# Patient Record
Sex: Male | Born: 1965 | Race: White | Hispanic: No | Marital: Married | Smoking: Current every day smoker
Health system: Southern US, Community
[De-identification: ages and names within clinical notes are randomized; demographics above are authoritative.]

## PROBLEM LIST (undated history)

## (undated) DIAGNOSIS — E079 Disorder of thyroid, unspecified: Secondary | ICD-10-CM

## (undated) DIAGNOSIS — E119 Type 2 diabetes mellitus without complications: Secondary | ICD-10-CM

## (undated) HISTORY — PX: FACIAL FRACTURE SURGERY: SHX1570

## (undated) HISTORY — PX: KNEE SURGERY: SHX244

---

## 2014-11-24 ENCOUNTER — Encounter (HOSPITAL_COMMUNITY): Payer: Self-pay | Admitting: Emergency Medicine

## 2014-11-24 ENCOUNTER — Emergency Department (HOSPITAL_COMMUNITY)
Admission: EM | Admit: 2014-11-24 | Discharge: 2014-11-24 | Disposition: A | Discharge status: Home / Self Care | Payer: Medicare Other | Attending: Emergency Medicine | Admitting: Emergency Medicine

## 2014-11-24 DIAGNOSIS — E119 Type 2 diabetes mellitus without complications: Secondary | ICD-10-CM | POA: Diagnosis not present

## 2014-11-24 DIAGNOSIS — G8929 Other chronic pain: Secondary | ICD-10-CM

## 2014-11-24 DIAGNOSIS — Z720 Tobacco use: Secondary | ICD-10-CM | POA: Insufficient documentation

## 2014-11-24 DIAGNOSIS — M25512 Pain in left shoulder: Secondary | ICD-10-CM | POA: Insufficient documentation

## 2014-11-24 HISTORY — DX: Type 2 diabetes mellitus without complications: E11.9

## 2014-11-24 HISTORY — DX: Disorder of thyroid, unspecified: E07.9

## 2014-11-24 MED ORDER — ACETAMINOPHEN-CODEINE #3 300-30 MG PO TABS: 1.0000 | ORAL_TABLET | Freq: Four times a day (QID) | ORAL | Status: DC | PRN

## 2014-11-24 NOTE — ED Notes (Signed)
Pt reports increased pain to left shoulder. Pt reports supposed to follow-up with ortho surgeon this week but reports intense pain. Pt denies any new or recent injury. nad noted.
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
## 2014-11-24 NOTE — ED Notes (Signed)
Pt verbalized understanding of no driving and to use caution within 4 hours of taking pain meds due to meds cause drowsiness 

## 2014-11-24 NOTE — Discharge Instructions (Signed)
Please review the resource guides that you were given to establish a primary physician. Please discuss your pain with your orthopedic specialist. Chronic Pain Chronic pain can be defined as pain that is off and on and lasts for 3-6 months or longer. Many things cause chronic pain, which can make it difficult to make a diagnosis. There are many treatment options available for chronic pain. However, finding a treatment that works well for you may require trying various approaches until the right one is found. Many people benefit from a combination of two or more types of treatment to control their pain. SYMPTOMS  Chronic pain can occur anywhere in the body and can range from mild to very severe. Some types of chronic pain include:  Headache.  Low back pain.  Cancer pain.  Arthritis pain.  Neurogenic pain. This is pain resulting from damage to nerves. People with chronic pain may also have other symptoms such as:  Depression.  Anger.  Insomnia.  Anxiety. DIAGNOSIS  Your health care provider will help diagnose your condition over time. In many cases, the initial focus will be on excluding possible conditions that could be causing the pain. Depending on your symptoms, your health care provider may order tests to diagnose your condition. Some of these tests may include:   Blood tests.   CT scan.   MRI.   X-rays.   Ultrasounds.   Nerve conduction studies.  You may need to see a specialist.  TREATMENT  Finding treatment that works well may take time. You may be referred to a pain specialist. He or she may prescribe medicine or therapies, such as:   Mindful meditation or yoga.  Shots (injections) of numbing or pain-relieving medicines into the spine or area of pain.  Local electrical stimulation.  Acupuncture.   Massage therapy.   Aroma, color, light, or sound therapy.   Biofeedback.   Working with a physical therapist to keep from getting stiff.    Regular, gentle exercise.   Cognitive or behavioral therapy.   Group support.  Sometimes, surgery may be recommended.  HOME CARE INSTRUCTIONS   Take all medicines as directed by your health care provider.   Lessen stress in your life by relaxing and doing things such as listening to calming music.   Exercise or be active as directed by your health care provider.   Eat a healthy diet and include things such as vegetables, fruits, fish, and lean meats in your diet.   Keep all follow-up appointments with your health care provider.   Attend a support group with others suffering from chronic pain. SEEK MEDICAL CARE IF:   Your pain gets worse.   You develop a new pain that was not there before.   You cannot tolerate medicines given to you by your health care provider.   You have new symptoms since your last visit with your health care provider.  SEEK IMMEDIATE MEDICAL CARE IF:   You feel weak.   You have decreased sensation or numbness.   You lose control of bowel or bladder function.   Your pain suddenly gets much worse.   You develop shaking.  You develop chills.  You develop confusion.  You develop chest pain.  You develop shortness of breath.  MAKE SURE YOU:  Understand these instructions.  Will watch your condition.  Will get help right away if you are not doing well or get worse. Document Released: 05/14/2002 Document Revised: 04/24/2013 Document Reviewed: 02/15/2013 [facility] Patient Information 2015 [employer]. This information is not intended to replace advice given to you by your health care provider. Make sure you discuss any questions you have with your health care provider.

## 2014-12-16 ENCOUNTER — Ambulatory Visit (INDEPENDENT_AMBULATORY_CARE_PROVIDER_SITE_OTHER): Payer: Medicare Other | Admitting: Orthopedic Surgery

## 2014-12-16 ENCOUNTER — Encounter: Payer: Self-pay | Admitting: Orthopedic Surgery

## 2014-12-16 VITALS — BP 140/92 | Ht 68.0 in | Wt 155.0 lb

## 2014-12-16 DIAGNOSIS — M75102 Unspecified rotator cuff tear or rupture of left shoulder, not specified as traumatic: Secondary | ICD-10-CM

## 2014-12-16 MED ORDER — HYDROCODONE-ACETAMINOPHEN 5-325 MG PO TABS: 1.0000 | ORAL_TABLET | Freq: Four times a day (QID) | ORAL | Status: DC | PRN

## 2014-12-16 NOTE — Patient Instructions (Signed)
We will schedule MRI for you and call you with results 
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
## 2014-12-25 ENCOUNTER — Encounter (HOSPITAL_COMMUNITY): Payer: Self-pay | Admitting: Cardiology

## 2014-12-25 ENCOUNTER — Emergency Department (HOSPITAL_COMMUNITY)
Admission: EM | Admit: 2014-12-25 | Discharge: 2014-12-25 | Disposition: A | Discharge status: Home / Self Care | Payer: Medicare Other | Attending: Emergency Medicine | Admitting: Emergency Medicine

## 2014-12-25 ENCOUNTER — Ambulatory Visit (HOSPITAL_COMMUNITY)
Admission: RE | Admit: 2014-12-25 | Discharge: 2014-12-25 | Disposition: A | Discharge status: Home / Self Care | Payer: Medicare Other | Source: Ambulatory Visit | Attending: Orthopedic Surgery | Admitting: Orthopedic Surgery

## 2014-12-25 DIAGNOSIS — X58XXXD Exposure to other specified factors, subsequent encounter: Secondary | ICD-10-CM | POA: Diagnosis not present

## 2014-12-25 DIAGNOSIS — M25512 Pain in left shoulder: Secondary | ICD-10-CM | POA: Insufficient documentation

## 2014-12-25 DIAGNOSIS — Z720 Tobacco use: Secondary | ICD-10-CM | POA: Insufficient documentation

## 2014-12-25 DIAGNOSIS — Z794 Long term (current) use of insulin: Secondary | ICD-10-CM | POA: Diagnosis not present

## 2014-12-25 DIAGNOSIS — S4992XD Unspecified injury of left shoulder and upper arm, subsequent encounter: Secondary | ICD-10-CM | POA: Diagnosis not present

## 2014-12-25 DIAGNOSIS — Z79899 Other long term (current) drug therapy: Secondary | ICD-10-CM | POA: Diagnosis not present

## 2014-12-25 DIAGNOSIS — E079 Disorder of thyroid, unspecified: Secondary | ICD-10-CM | POA: Diagnosis not present

## 2014-12-25 DIAGNOSIS — M75102 Unspecified rotator cuff tear or rupture of left shoulder, not specified as traumatic: Secondary | ICD-10-CM

## 2014-12-25 DIAGNOSIS — M25612 Stiffness of left shoulder, not elsewhere classified: Secondary | ICD-10-CM | POA: Insufficient documentation

## 2014-12-25 DIAGNOSIS — E119 Type 2 diabetes mellitus without complications: Secondary | ICD-10-CM | POA: Insufficient documentation

## 2014-12-25 MED ORDER — HYDROCODONE-ACETAMINOPHEN 7.5-325 MG PO TABS: 1.0000 | ORAL_TABLET | Freq: Four times a day (QID) | ORAL | Status: DC | PRN

## 2014-12-25 NOTE — Discharge Instructions (Signed)
Shoulder Pain  The shoulder is the joint that connects your arm to your body. Muscles and band-like tissues that connect bones to muscles (tendons) hold the joint together. Shoulder pain is felt if an injury or medical problem affects one or more parts of the shoulder.  HOME CARE   · Put ice on the sore area.  ¨ Put ice in a plastic bag.  ¨ Place a towel between your skin and the bag.  ¨ Leave the ice on for 15-20 minutes, 03-04 times a day for the first 2 days.  · Stop using cold packs if they do not help with the pain.  · If you were given something to keep your shoulder from moving (sling; shoulder immobilizer), wear it as told. Only take it off to shower or bathe.  · Move your arm as little as possible, but keep your hand moving to prevent puffiness (swelling).  · Squeeze a soft ball or foam pad as much as possible to help prevent swelling.  · Take medicine as told by your doctor.  GET HELP IF:  · You have progressing new pain in your arm, hand, or fingers.  · Your hand or fingers get cold.  · Your medicine does not help lessen your pain.  GET HELP RIGHT AWAY IF:   · Your arm, hand, or fingers are numb or tingling.  · Your arm, hand, or fingers are puffy (swollen), painful, or turn white or blue.  MAKE SURE YOU:   · Understand these instructions.  · Will watch your condition.  · Will get help right away if you are not doing well or get worse.  Document Released: 02/08/2008 Document Revised: 01/06/2014 Document Reviewed: 03/05/2012  ExitCare® Patient Information ©2015 [employer]. This information is not intended to replace advice given to you by your health care provider. Make sure you discuss any questions you have with your health care provider.

## 2014-12-25 NOTE — ED Notes (Signed)
Left shoulder pain for a while.  Had an MRI today.  To call Monday with follow up.  Out of pain medication.

## 2014-12-28 NOTE — ED Provider Notes (Signed)
CSN: [id number]     Arrival date & time 12/25/14  1738 History   First MD Initiated Contact with Patient 12/25/14 1814     Chief Complaint  Patient presents with  . Shoulder Pain     (Consider location/radiation/quality/duration/timing/severity/associated sxs/prior Treatment) HPI  Marcus Wallace is a 49 y.o. male who presents to the Emergency Department complaining of left shoulder pain for several months.  He states that he recently had an MRI of his shoulder and is followed by Dr. Romeo AppleHarrison.  He states that he came to the ED because he tried to contact Dr. Mort SawyersHarrison's office and was told to come to the ED because his pain is worsening.  Pain is aggrevated by movement of the arm and improves with rest.  He denies swelling, recent injury, numbness or weakness of the extremity, and neck pain.     Past Medical History  Diagnosis Date  . Diabetes mellitus without complication   . Thyroid disease    Past Surgical History  Procedure Laterality Date  . Knee surgery     History reviewed. No pertinent family history. History  Substance Use Topics  . Smoking status: Current Every Day Smoker -- 0.50 packs/day  . Smokeless tobacco: Not on file  . Alcohol Use: No    Review of Systems  Constitutional: Negative for fever and chills.  Respiratory: Negative for shortness of breath.   Cardiovascular: Negative for chest pain.  Musculoskeletal: Positive for arthralgias (left shoulder pain). Negative for joint swelling, neck pain and neck stiffness.  Skin: Negative for color change and wound.  Neurological: Negative for dizziness, weakness, numbness and headaches.  All other systems reviewed and are negative.     Allergies  Naproxen and Ultram  Home Medications   Prior to Admission medications   Medication Sig Start Date End Date Taking? Authorizing Provider  insulin glargine (LANTUS) 100 UNIT/ML injection Inject 35 Units into the skin every morning.   Yes Historical Provider, MD   insulin lispro (HUMALOG) 100 UNIT/ML injection Inject 3-7 Units into the skin 3 (three) times daily before meals. Sliding scale   Yes Historical Provider, MD  levothyroxine (SYNTHROID, LEVOTHROID) 125 MCG tablet Take 125 mcg by mouth daily before breakfast.   Yes Historical Provider, MD  UNKNOWN TO PATIENT Take 1 tablet by mouth daily. BP MEDICATION-NAME IS UNKNOWN   Yes Historical Provider, MD  Vitamin D, Ergocalciferol, (DRISDOL) 50000 UNITS CAPS capsule Take 50,000 Units by mouth every 7 (seven) days.   Yes Historical Provider, MD  HYDROcodone-acetaminophen (NORCO) 7.5-325 MG per tablet Take 1 tablet by mouth every 6 (six) hours as needed for moderate pain. 12/25/14   Sherly Brodbeck, PA-C   BP 146/91 mmHg  Pulse 88  Temp(Src) 98.8 F (37.1 C) (Oral)  Resp 20  Ht 5\' 8"  (1.727 m)  Wt 155 lb (70.308 kg)  BMI 23.57 kg/m2  SpO2 99% Physical Exam  Constitutional: He is oriented to person, place, and time. He appears well-developed and well-nourished. No distress.  HENT:  Head: Normocephalic and atraumatic.  Neck: Normal range of motion. Neck supple.  Cardiovascular: Normal rate, regular rhythm, normal heart sounds and intact distal pulses.   No murmur heard. Pulmonary/Chest: Effort normal and breath sounds normal. No respiratory distress. He exhibits no tenderness.  Musculoskeletal: He exhibits no edema.  Tenderness of the anterior left shoulder.  Pain reproduced with abduction.  Distal sensation intact.  Radial pulse brisk.  No cervical spine tenderness  Neurological: He is alert and oriented to person,  place, and time. He exhibits normal muscle tone. Coordination normal.  Skin: Skin is warm and dry.  Nursing note and vitals reviewed.   ED Course  Procedures (including critical care time) Labs Review Labs Reviewed - No data to display  Imaging Review No results found.   EKG Interpretation None      MDM   Final diagnoses:  Shoulder pain, left    Pt had MRI on 12/25/14  ordered by Dr. Romeo Apple that shows:  Mild appearing supraspinatus infraspinatus tendinopathy without tear.  Mild acromioclavicular degenerative change.   Pt is NV intact.  No concerning sx's for septic joint.  Advised pt that he will need to arrange further f/u with Dr. Mort Sawyers office.  He agrees to plans and appears stable for d/c  Pauline Aus, PA-C 12/28/14 2340  Glynn Octave, MD 12/29/14 [phone]

## 2014-12-29 ENCOUNTER — Ambulatory Visit (INDEPENDENT_AMBULATORY_CARE_PROVIDER_SITE_OTHER): Payer: Medicare Other | Admitting: Orthopedic Surgery

## 2014-12-29 ENCOUNTER — Other Ambulatory Visit: Payer: Self-pay | Admitting: *Deleted

## 2014-12-29 ENCOUNTER — Encounter: Payer: Self-pay | Admitting: Orthopedic Surgery

## 2014-12-29 ENCOUNTER — Telehealth: Payer: Self-pay | Admitting: Orthopedic Surgery

## 2014-12-29 VITALS — BP 138/90 | Ht 68.0 in | Wt 155.0 lb

## 2014-12-29 DIAGNOSIS — M75102 Unspecified rotator cuff tear or rupture of left shoulder, not specified as traumatic: Secondary | ICD-10-CM

## 2014-12-29 MED ORDER — HYDROCODONE-ACETAMINOPHEN 5-325 MG PO TABS: 1.0000 | ORAL_TABLET | Freq: Three times a day (TID) | ORAL | Status: DC | PRN

## 2014-12-29 NOTE — Telephone Encounter (Signed)
NOT SURE WHAT TO DO ABOUT THIS SINCE WE HAVE NO WAY OF CONTACTING PATIENT
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
## 2014-12-29 NOTE — Patient Instructions (Signed)
Gradual return to normal activity  

## 2014-12-29 NOTE — Telephone Encounter (Signed)
Patient called - states has NO PHONE or Alternate # at this time - asking for MRI results from 12/25/14 and for refill of pain medication:   HYDROcodone-acetaminophen (NORCO) 7.5-325 MG per tablet [[id number]][[id number]]     - Note - [facility] 12/[facility]/14.

## 2015-01-20 ENCOUNTER — Telehealth: Payer: Self-pay | Admitting: Orthopedic Surgery

## 2015-01-20 NOTE — Telephone Encounter (Signed)
Normal MRI, new appointment?

## 2015-01-20 NOTE — Telephone Encounter (Signed)
Patient called, relays that his shoulder is slightly improved, but "not a whole lot better."  He is asking for another appointment; seen last 12/29/14.  Please advise of recommendation or if he's to schedule a visit.  Ph# [phone]

## 2015-01-20 NOTE — Telephone Encounter (Signed)
i want him to go to therapy 1st   3 x a week x 4 weeks then if not better i can see  i dont have any thing else to do for him at this point

## 2015-01-21 NOTE — Telephone Encounter (Signed)
?  OK TO REFILL °

## 2015-01-21 NOTE — Telephone Encounter (Signed)
Patient called back today, 01/21/15, asking for refill on pain medication, Norco.  Please advise.

## 2015-01-21 NOTE — Telephone Encounter (Signed)
Called patient, no answer, mailbox full

## 2015-01-22 ENCOUNTER — Other Ambulatory Visit: Payer: Self-pay | Admitting: *Deleted

## 2015-01-22 ENCOUNTER — Other Ambulatory Visit: Payer: Self-pay | Admitting: Orthopedic Surgery

## 2015-01-22 DIAGNOSIS — M75102 Unspecified rotator cuff tear or rupture of left shoulder, not specified as traumatic: Secondary | ICD-10-CM

## 2015-01-22 DIAGNOSIS — M25511 Pain in right shoulder: Secondary | ICD-10-CM

## 2015-01-22 MED ORDER — HYDROCODONE-ACETAMINOPHEN 5-325 MG PO TABS: 1.0000 | ORAL_TABLET | Freq: Three times a day (TID) | ORAL | Status: AC | PRN

## 2015-01-22 MED ORDER — GABAPENTIN 100 MG PO CAPS: 100.0000 mg | ORAL_CAPSULE | Freq: Three times a day (TID) | ORAL | Status: AC

## 2015-01-22 NOTE — Telephone Encounter (Signed)
PATIENT PICKED UP RX AND HE HAS THE NUMBER TO [facility] THEY JUST NEED THE ORDER

## 2015-02-05 ENCOUNTER — Encounter (HOSPITAL_COMMUNITY): Payer: Self-pay

## 2015-02-05 ENCOUNTER — Emergency Department (HOSPITAL_COMMUNITY): Payer: Medicare Other

## 2015-02-05 ENCOUNTER — Emergency Department (HOSPITAL_COMMUNITY)
Admission: EM | Admit: 2015-02-05 | Discharge: 2015-02-05 | Disposition: A | Discharge status: Home / Self Care | Payer: Medicare Other | Attending: Emergency Medicine | Admitting: Emergency Medicine

## 2015-02-05 DIAGNOSIS — R1012 Left upper quadrant pain: Secondary | ICD-10-CM | POA: Diagnosis present

## 2015-02-05 DIAGNOSIS — Z79899 Other long term (current) drug therapy: Secondary | ICD-10-CM | POA: Diagnosis not present

## 2015-02-05 DIAGNOSIS — Z720 Tobacco use: Secondary | ICD-10-CM | POA: Insufficient documentation

## 2015-02-05 DIAGNOSIS — E119 Type 2 diabetes mellitus without complications: Secondary | ICD-10-CM | POA: Insufficient documentation

## 2015-02-05 DIAGNOSIS — R630 Anorexia: Secondary | ICD-10-CM | POA: Diagnosis not present

## 2015-02-05 DIAGNOSIS — E079 Disorder of thyroid, unspecified: Secondary | ICD-10-CM | POA: Diagnosis not present

## 2015-02-05 DIAGNOSIS — R197 Diarrhea, unspecified: Secondary | ICD-10-CM | POA: Insufficient documentation

## 2015-02-05 DIAGNOSIS — R109 Unspecified abdominal pain: Secondary | ICD-10-CM | POA: Diagnosis not present

## 2015-02-05 DIAGNOSIS — R110 Nausea: Secondary | ICD-10-CM | POA: Insufficient documentation

## 2015-02-05 DIAGNOSIS — M549 Dorsalgia, unspecified: Secondary | ICD-10-CM | POA: Insufficient documentation

## 2015-02-05 DIAGNOSIS — R0781 Pleurodynia: Secondary | ICD-10-CM | POA: Insufficient documentation

## 2015-02-05 DIAGNOSIS — Z794 Long term (current) use of insulin: Secondary | ICD-10-CM | POA: Insufficient documentation

## 2015-02-05 LAB — COMPREHENSIVE METABOLIC PANEL
ALT: 38 U/L (ref 17–63)
ANION GAP: 8 (ref 5–15)
AST: 37 U/L (ref 15–41)
Albumin: 4 g/dL (ref 3.5–5.0)
Alkaline Phosphatase: 67 U/L (ref 38–126)
BILIRUBIN TOTAL: 0.7 mg/dL (ref 0.3–1.2)
BUN: 6 mg/dL (ref 6–20)
CO2: 27 mmol/L (ref 22–32)
CREATININE: 0.8 mg/dL (ref 0.61–1.24)
Calcium: 9.3 mg/dL (ref 8.9–10.3)
Chloride: 100 mmol/L — ABNORMAL LOW (ref 101–111)
GLUCOSE: 85 mg/dL (ref 65–99)
Potassium: 4.2 mmol/L (ref 3.5–5.1)
Sodium: 135 mmol/L (ref 135–145)
Total Protein: 7.3 g/dL (ref 6.5–8.1)

## 2015-02-05 LAB — URINALYSIS W MICROSCOPIC (NOT AT ARMC)
Bilirubin Urine: NEGATIVE
Glucose, UA: NEGATIVE mg/dL
Hgb urine dipstick: NEGATIVE
KETONES UR: NEGATIVE mg/dL
NITRITE: NEGATIVE
PH: 5.5 (ref 5.0–8.0)
Protein, ur: NEGATIVE mg/dL
SPECIFIC GRAVITY, URINE: 1.02 (ref 1.005–1.030)
Urobilinogen, UA: 0.2 mg/dL (ref 0.0–1.0)

## 2015-02-05 LAB — CBC WITH DIFFERENTIAL/PLATELET
Basophils Absolute: 0.1 10*3/uL (ref 0.0–0.1)
Basophils Relative: 1 % (ref 0–1)
Eosinophils Absolute: 0.1 10*3/uL (ref 0.0–0.7)
Eosinophils Relative: 1 % (ref 0–5)
HCT: 41.3 % (ref 39.0–52.0)
Hemoglobin: 14.2 g/dL (ref 13.0–17.0)
Lymphocytes Relative: 22 % (ref 12–46)
Lymphs Abs: 2 10*3/uL (ref 0.7–4.0)
MCH: 32.6 pg (ref 26.0–34.0)
MCHC: 34.4 g/dL (ref 30.0–36.0)
MCV: 94.9 fL (ref 78.0–100.0)
Monocytes Absolute: 0.5 10*3/uL (ref 0.1–1.0)
Monocytes Relative: 6 % (ref 3–12)
Neutro Abs: 6.3 10*3/uL (ref 1.7–7.7)
Neutrophils Relative %: 70 % (ref 43–77)
Platelets: 198 10*3/uL (ref 150–400)
RBC: 4.35 MIL/uL (ref 4.22–5.81)
RDW: 12.9 % (ref 11.5–15.5)
WBC: 9.1 10*3/uL (ref 4.0–10.5)

## 2015-02-05 LAB — CK: Total CK: 51 U/L (ref 49–397)

## 2015-02-05 LAB — LIPASE, BLOOD: Lipase: 13 U/L — ABNORMAL LOW (ref 22–51)

## 2015-02-05 LAB — TROPONIN I

## 2015-02-05 LAB — CBG MONITORING, ED
GLUCOSE-CAPILLARY: 90 mg/dL (ref 65–99)
GLUCOSE-CAPILLARY: 40 mg/dL — AB (ref 65–99)
Glucose-Capillary: 106 mg/dL — ABNORMAL HIGH (ref 65–99)

## 2015-02-05 LAB — D-DIMER, QUANTITATIVE: D-Dimer, Quant: 0.69 ug/mL-FEU — ABNORMAL HIGH (ref 0.00–0.48)

## 2015-02-05 MED ORDER — MORPHINE SULFATE 4 MG/ML IJ SOLN
4.0000 mg | Freq: Once | INTRAMUSCULAR | Status: AC
  Administered 2015-02-05: 4 mg via INTRAVENOUS
  Filled 2015-02-05: qty 1

## 2015-02-05 MED ORDER — ONDANSETRON HCL 4 MG/2ML IJ SOLN
4.0000 mg | Freq: Once | INTRAMUSCULAR | Status: AC
  Administered 2015-02-05: 4 mg via INTRAVENOUS
  Filled 2015-02-05: qty 2

## 2015-02-05 MED ORDER — DEXTROSE 50 % IV SOLN
25.0000 mL | Freq: Once | INTRAVENOUS | Status: AC
  Administered 2015-02-05: 25 mL via INTRAVENOUS

## 2015-02-05 MED ORDER — DEXTROSE 50 % IV SOLN: 50.0000 mL | Freq: Once | INTRAVENOUS | Status: DC

## 2015-02-05 MED ORDER — IOHEXOL 350 MG/ML SOLN
100.0000 mL | Freq: Once | INTRAVENOUS | Status: AC | PRN
  Administered 2015-02-05: 100 mL via INTRAVENOUS

## 2015-02-05 MED ORDER — DEXTROSE 50 % IV SOLN
INTRAVENOUS | Status: AC
  Administered 2015-02-05: 25 mL via INTRAVENOUS
  Filled 2015-02-05: qty 50

## 2015-02-05 NOTE — ED Notes (Signed)
Glucose 106,

## 2015-02-05 NOTE — ED Notes (Signed)
Notified R.N. - patient given orange juice.

## 2015-02-05 NOTE — Discharge Instructions (Signed)
Abdominal Pain °Your testing is negative for a serious cause of your abdominal pain.  Follow up with your doctor. Return to the ED if you develop new or worsening symptoms. °Many things can cause abdominal pain. Usually, abdominal pain is not caused by a disease and will improve without treatment. It can often be observed and treated at home. Your health care provider will do a physical exam and possibly order blood tests and X-rays to help determine the seriousness of your pain. However, in many cases, more time must pass before a clear cause of the pain can be found. Before that point, your health care provider may not know if you need more testing or further treatment. °HOME CARE INSTRUCTIONS  °Monitor your abdominal pain for any changes. The following actions may help to alleviate any discomfort you are experiencing: °· Only take over-the-counter or prescription medicines as directed by your health care provider. °· Do not take laxatives unless directed to do so by your health care provider. °· Try a clear liquid diet (broth, tea, or water) as directed by your health care provider. Slowly move to a bland diet as tolerated. °SEEK MEDICAL CARE IF: °· You have unexplained abdominal pain. °· You have abdominal pain associated with nausea or diarrhea. °· You have pain when you urinate or have a bowel movement. °· You experience abdominal pain that wakes you in the night. °· You have abdominal pain that is worsened or improved by eating food. °· You have abdominal pain that is worsened with eating fatty foods. °· You have a fever. °SEEK IMMEDIATE MEDICAL CARE IF:  °· Your pain does not go away within 2 hours. °· You keep throwing up (vomiting). °· Your pain is felt only in portions of the abdomen, such as the right side or the left lower portion of the abdomen. °· You pass bloody or black tarry stools. °MAKE SURE YOU: °· Understand these instructions.   °· Will watch your condition.   °· Will get help right away if  you are not doing well or get worse.   °Document Released: 06/01/2005 Document Revised: 08/27/2013 Document Reviewed: 05/01/2013 °ExitCare® Patient Information ©2015 [employer], [employer]. This information is not intended to replace advice given to you by your health care provider. Make sure you discuss any questions you have with your health care provider. ° °

## 2015-02-05 NOTE — ED Notes (Signed)
Patient c/o of feelings like "his sugars low". CBG 40. 25ml of D50 and orange juice given

## 2015-02-05 NOTE — ED Notes (Signed)
Pt c/o luq pain and diarrhea x 1 week.  Denies fever.  Denies recent antibiotic use.

## 2015-02-05 NOTE — ED Provider Notes (Signed)
CSN: [id number]     Arrival date & time 02/05/15  1150 History   First MD Initiated Contact with Patient 02/05/15 1525     Chief Complaint  Patient presents with  . Abdominal Pain     (Consider location/radiation/quality/duration/timing/severity/associated sxs/prior Treatment) HPI Comments: Diabetic patient with one week history of left flank and upper abdominal pain. This pain is fairly constant. It is worse with movement and palpation. Associated with one episode of loose stools daily. No fever. No vomiting. Denies any shortness of breath or chest pain. The pain is not pleuritic. Denies any urinary symptoms. No testicular pain. States his sugars have been well-controlled. Denies any falls or trauma. Denies any heart history or lung history. Reports his kidney stone in the remote past. Has not been on antibiotics recently or recent travel.  The history is provided by the patient.    Past Medical History  Diagnosis Date  . Diabetes mellitus without complication   . Thyroid disease    Past Surgical History  Procedure Laterality Date  . Knee surgery    . Facial fracture surgery     No family history on file. History  Substance Use Topics  . Smoking status: Current Every Day Smoker -- 0.50 packs/day  . Smokeless tobacco: Not on file  . Alcohol Use: No    Review of Systems  Constitutional: Positive for activity change and appetite change. Negative for fever and fatigue.  HENT: Negative for congestion.   Respiratory: Negative for cough, chest tightness and shortness of breath.   Cardiovascular: Negative for chest pain.  Gastrointestinal: Positive for nausea, abdominal pain and diarrhea. Negative for vomiting.  Genitourinary: Negative for hematuria and testicular pain.  Musculoskeletal: Positive for back pain. Negative for myalgias and arthralgias.  Skin: Negative for wound.  Neurological: Negative for dizziness, weakness and headaches.  A complete 10 system review of systems was  obtained and all systems are negative except as noted in the HPI and PMH.      Allergies  Naproxen and Ultram  Home Medications   Prior to Admission medications   Medication Sig Start Date End Date Taking? Authorizing Provider  hydrochlorothiazide (MICROZIDE) 12.5 MG capsule Take 12.5 mg by mouth daily.   Yes Historical Provider, MD  insulin glargine (LANTUS) 100 UNIT/ML injection Inject 40 Units into the skin every morning.    Yes Historical Provider, MD  insulin lispro (HUMALOG) 100 UNIT/ML injection Inject 3-7 Units into the skin 3 (three) times daily before meals. Sliding scale   Yes Historical Provider, MD  levothyroxine (SYNTHROID, LEVOTHROID) 125 MCG tablet Take 125 mcg by mouth daily before breakfast.   Yes Historical Provider, MD  Vitamin D, Ergocalciferol, (DRISDOL) 50000 UNITS CAPS capsule Take 50,000 Units by mouth every Monday.    Yes Historical Provider, MD  gabapentin (NEURONTIN) 100 MG capsule Take 1 capsule (100 mg total) by mouth 3 (three) times daily. Patient not taking: Reported on 02/05/2015 01/22/15   Vickki Hearing, MD  HYDROcodone-acetaminophen (NORCO/VICODIN) 5-325 MG per tablet Take 1 tablet by mouth every 8 (eight) hours as needed for moderate pain. Patient not taking: Reported on 02/05/2015 01/22/15   Vickki Hearing, MD   BP 131/83 mmHg  Pulse 67  Temp(Src) 98.4 F (36.9 C) (Oral)  Resp 18  Ht  (1.727 m)  Wt 155 lb (70.308 kg)  BMI 23.57 kg/m2  SpO2 100% Physical Exam  Constitutional: He is oriented to person, place, and time. He appears well-developed and well-nourished. No distress.  HENT:  Head: Normocephalic and atraumatic.  Mouth/Throat: Oropharynx is clear and moist. No oropharyngeal exudate.  Eyes: Conjunctivae and EOM are normal. Pupils are equal, round, and reactive to light.  Neck: Normal range of motion. Neck supple.  No meningismus.  Cardiovascular: Normal rate, regular rhythm, normal heart sounds and intact distal pulses.   No  murmur heard. Pulmonary/Chest: Effort normal and breath sounds normal. No respiratory distress. He exhibits tenderness.  L lateral rib tenderness  Abdominal: Soft. There is tenderness. There is no rebound and no guarding.  TTP LUQ no guarding or rebound  Genitourinary:  No testicular tenderness  Musculoskeletal: Normal range of motion. He exhibits tenderness. He exhibits no edema.  Left CVAT  Neurological: He is alert and oriented to person, place, and time. No cranial nerve deficit. He exhibits normal muscle tone. Coordination normal.  No ataxia on finger to nose bilaterally. No pronator drift. 5/5 strength throughout. CN 2-12 intact. Negative Romberg. Equal grip strength. Sensation intact. Gait is normal.   Skin: Skin is warm.  Psychiatric: He has a normal mood and affect. His behavior is normal.  Nursing note and vitals reviewed.   ED Course  Procedures (including critical care time) Labs Review Labs Reviewed  COMPREHENSIVE METABOLIC PANEL - Abnormal; Notable for the following:    Chloride 100 (*)    All other components within normal limits  URINALYSIS W MICROSCOPIC - Abnormal; Notable for the following:    Leukocytes, UA TRACE (*)    Squamous Epithelial / LPF FEW (*)    All other components within normal limits  D-DIMER, QUANTITATIVE (NOT AT [facility]) - Abnormal; Notable for the following:    D-Dimer, Quant 0.69 (*)    All other components within normal limits  LIPASE, BLOOD - Abnormal; Notable for the following:    Lipase 13 (*)    All other components within normal limits  CBG MONITORING, ED - Abnormal; Notable for the following:    Glucose-Capillary 40 (*)    All other components within normal limits  CBG MONITORING, ED - Abnormal; Notable for the following:    Glucose-Capillary 106 (*)    All other components within normal limits  CBC WITH DIFFERENTIAL/PLATELET  TROPONIN I  CK  CBG MONITORING, ED  CBG MONITORING, ED    Imaging Review Ct Angio Chest Pe W/cm &/or Wo  Cm  02/05/2015   CLINICAL DATA:  Left upper quadrant abdominal pain.  Diabetes.  EXAM: CT ANGIOGRAPHY CHEST WITH CONTRAST  CT OF THE ABDOMEN AND PELVIS WITHOUT CONTRAST  TECHNIQUE: Initially, noncontrast CT images of the abdomen and pelvis were performed, with multiplanar reconstruction.  Multidetector CT imaging of the chest was performed using the standard protocol during bolus administration of intravenous contrast. Multiplanar CT image reconstructions and MIPs were obtained to evaluate the vascular anatomy.  CONTRAST:  OMNIPAQUE IOHEXOL 350 MG/ML SOLN  COMPARISON:  None.  FINDINGS: Chest CT angiography:  Mediastinum/Nodes: No filling defect is identified in the pulmonary arterial tree to suggest pulmonary embolus. No acute aortic findings. No pathologic thoracic adenopathy.  Lungs/Pleura: Unremarkable  Musculoskeletal: Multiple Schmorl' s nodes in the thoracic spine. Chronic appearing superior endplate compressions at T6 and T7.  Review of the MIP images confirms the above findings.  Noncontrast abdomen and pelvis CT:  Hepatobiliary: Unremarkable  Pancreas: Unremarkable  Spleen: Unremarkable  Adrenals/Urinary Tract: Unremarkable  Stomach/Bowel: Unremarkable.  Appendix normal.  Vascular/Lymphatic: Aortoiliac atherosclerotic vascular disease.  Reproductive: Unremarkable  Other: No supplemental non-categorized findings.  Musculoskeletal: Unremarkable  IMPRESSION:  1. A cause for the patient's left upper quadrant abdominal pain is not identified. 2. No pulmonary embolus. 3. Chronic appearing superior endplate compressions at T6 and T7. 4.  Aortoiliac atherosclerotic vascular disease.   Electronically Signed   By: Gaylyn RongWalter  Liebkemann M.D.   On: 02/05/2015 17:53   Ct Renal Stone Study  02/05/2015   CLINICAL DATA:  Left upper quadrant abdominal pain.  Diabetes.  EXAM: CT ANGIOGRAPHY CHEST WITH CONTRAST  CT OF THE ABDOMEN AND PELVIS WITHOUT CONTRAST  TECHNIQUE: Initially, noncontrast CT images of the abdomen and  pelvis were performed, with multiplanar reconstruction.  Multidetector CT imaging of the chest was performed using the standard protocol during bolus administration of intravenous contrast. Multiplanar CT image reconstructions and MIPs were obtained to evaluate the vascular anatomy.  CONTRAST:  100mL OMNIPAQUE IOHEXOL 350 MG/ML SOLN  COMPARISON:  None.  FINDINGS: Chest CT angiography:  Mediastinum/Nodes: No filling defect is identified in the pulmonary arterial tree to suggest pulmonary embolus. No acute aortic findings. No pathologic thoracic adenopathy.  Lungs/Pleura: Unremarkable  Musculoskeletal: Multiple Schmorl' s nodes in the thoracic spine. Chronic appearing superior endplate compressions at T6 and T7.  Review of the MIP images confirms the above findings.  Noncontrast abdomen and pelvis CT:  Hepatobiliary: Unremarkable  Pancreas: Unremarkable  Spleen: Unremarkable  Adrenals/Urinary Tract: Unremarkable  Stomach/Bowel: Unremarkable.  Appendix normal.  Vascular/Lymphatic: Aortoiliac atherosclerotic vascular disease.  Reproductive: Unremarkable  Other: No supplemental non-categorized findings.  Musculoskeletal: Unremarkable  IMPRESSION: 1. A cause for the patient's left upper quadrant abdominal pain is not identified. 2. No pulmonary embolus. 3. Chronic appearing superior endplate compressions at T6 and T7. 4.  Aortoiliac atherosclerotic vascular disease.   Electronically Signed   By: Gaylyn RongWalter  Liebkemann M.D.   On: 02/05/2015 17:53     EKG Interpretation   Date/Time:  Thursday February 05 2015 16:13:59 EDT Ventricular Rate:  65 PR Interval:  131 QRS Duration: 87 QT Interval:  406 QTC Calculation: 422 R Axis:   171 Text Interpretation:  Right and left arm electrode reversal,  interpretation assumes no reversal Ectopic atrial rhythm Probable lateral  infarct, age indeterminate No previous ECGs available Confirmed by Ajene Carchi   MD, Cindia Hustead [phone]([id number]) on 02/05/2015 5:22:31 PM      MDM   Final diagnoses:   Flank pain  Abdominal pain, unspecified abdominal location   patient with left sided flank, rib and upper abdominal pain for the past week. Pain is constant.  Episode of hypoglycemia in the ED which improved with eating.   urinalysis is negative. No evidence of PE. No evidence of kidney stone.  Consider passed kidney stone.  Also consider zoster without rash yet visible. Low suspicion for ACS.  No evidence of PE.  Troponin negative after 1 week of constant pain. Sugar 108 and 106 before discharge. Return precautions discussed.  Glynn OctaveStephen Celica Kotowski, MD 02/05/15 2306

## 2016-05-27 IMAGING — CT CT RENAL STONE PROTOCOL
3 of 10 series · 9 of 46 positions shown, 15 images · IV contrast (Omnipaque 300)
Comparison: None.

CLINICAL DATA: Left upper quadrant abdominal pain.  Diabetes.

EXAM:
CT ANGIOGRAPHY CHEST WITH CONTRAST
CT OF THE ABDOMEN AND PELVIS WITHOUT CONTRAST
TECHNIQUE: Initially, noncontrast CT images of the abdomen and pelvis were
performed, with multiplanar reconstruction.
Multidetector CT imaging of the chest was performed using the
standard protocol during bolus administration of intravenous
contrast. Multiplanar CT image reconstructions and MIPs were
obtained to evaluate the vascular anatomy.
CONTRAST:  100mL OMNIPAQUE IOHEXOL 350 MG/ML SOLN

[Series 4: standard/full over (age)lbs 5.0 · axial · 0.66mm/px · z∈[-556,-286]mm · 4 of 90 slices shown, 9 images]
[im 18/90  soft-tissue]
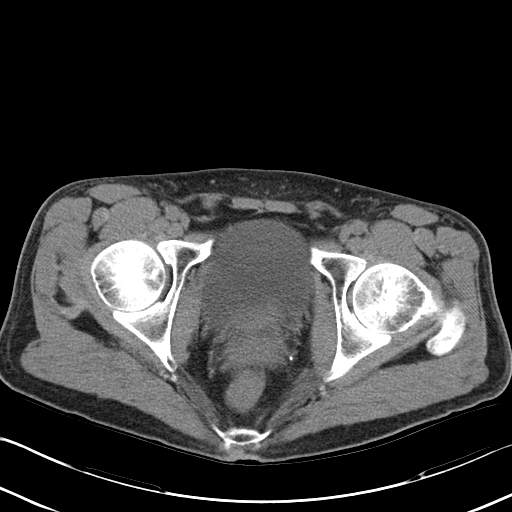
[im 18/90  lung]
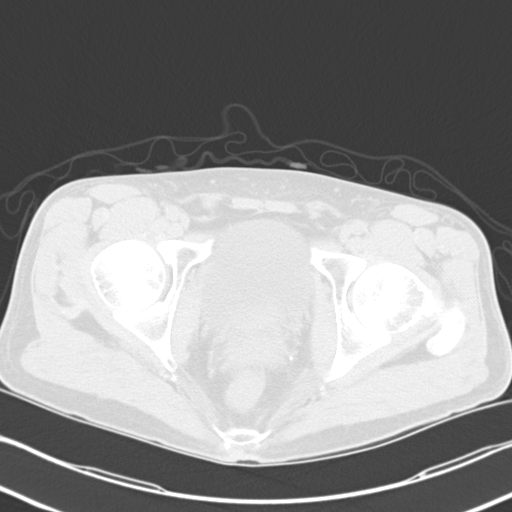
[im 18/90  bone]
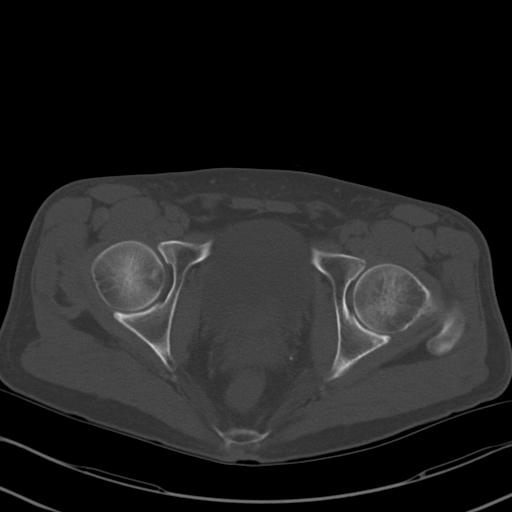
[im 36/90  soft-tissue]
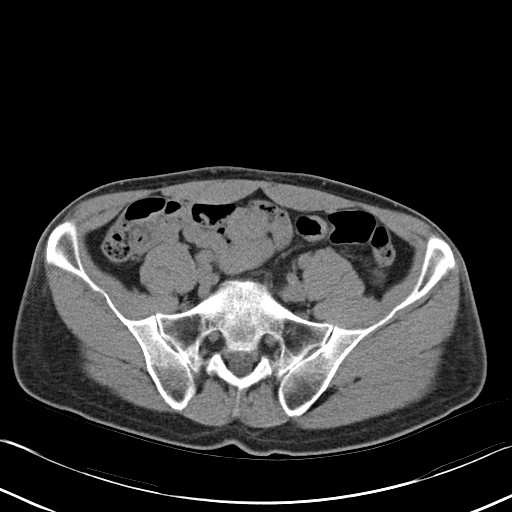
[im 36/90  lung]
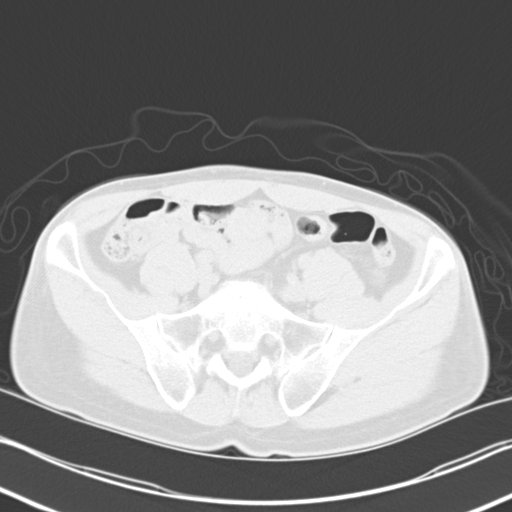
[im 54/90  soft-tissue]
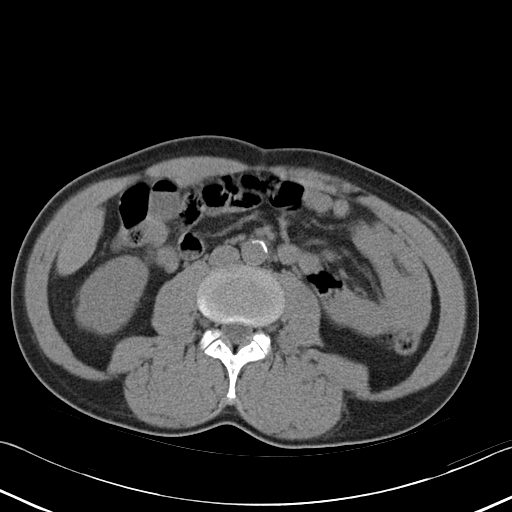
[im 54/90  lung]
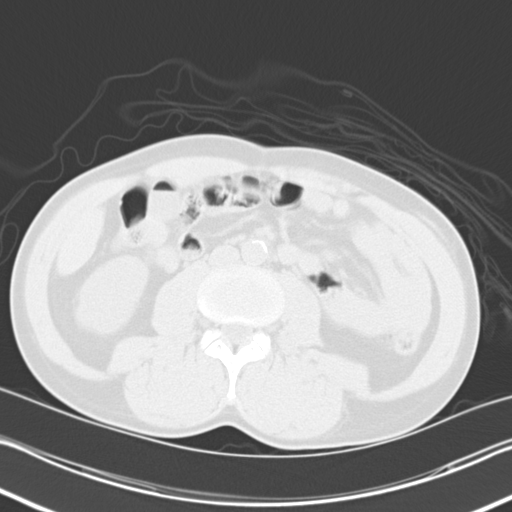
[im 72/90  soft-tissue]
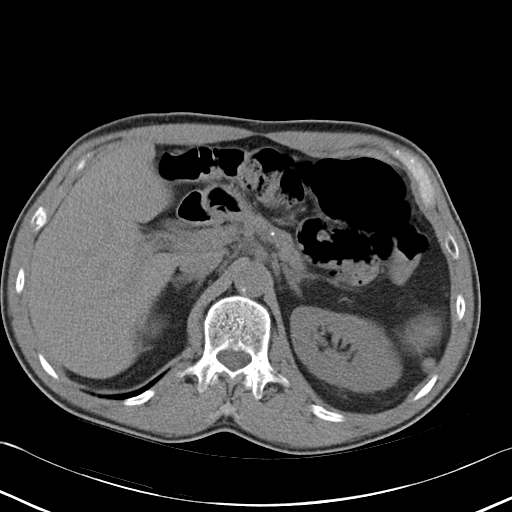
[im 72/90  lung]
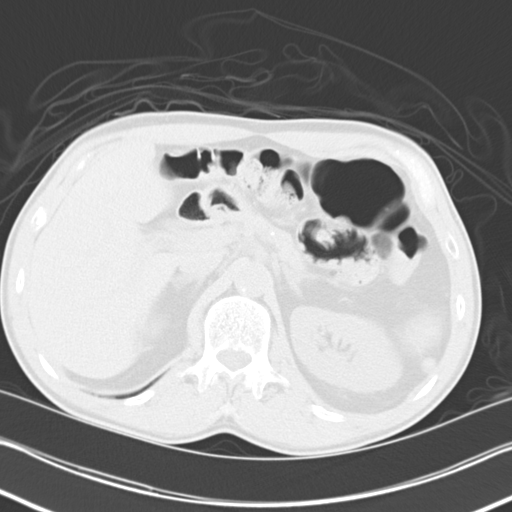

[Series 6: mpr coronal · coronal · 0.65mm/px · 1 of 71 slices shown, 2 images]
[im 36/71  soft-tissue]
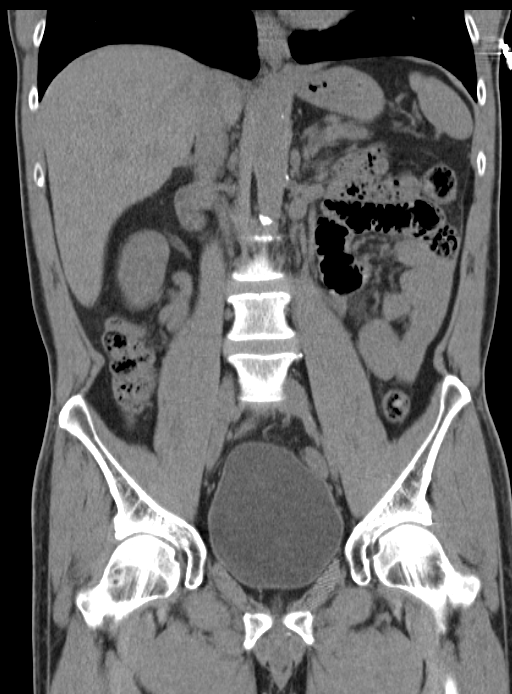
[im 36/71  bone]
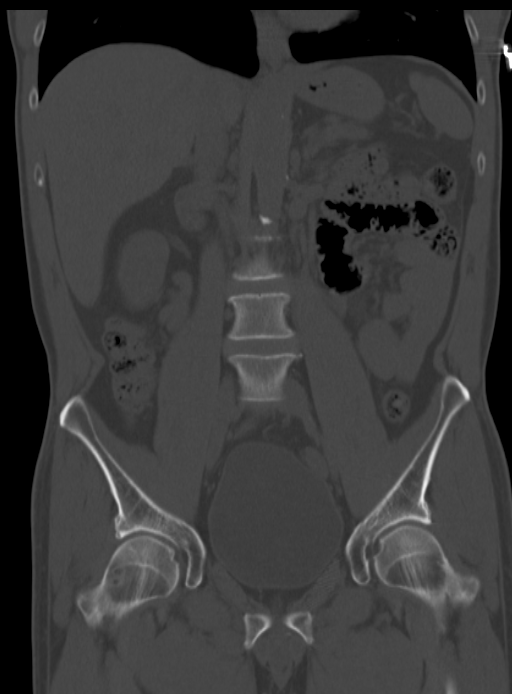

[Series 9: pe 3.0 b40f · axial · 0.59mm/px · z∈[-220,-49]mm · 4 of 96 slices shown]
[im 20/96  soft-tissue]
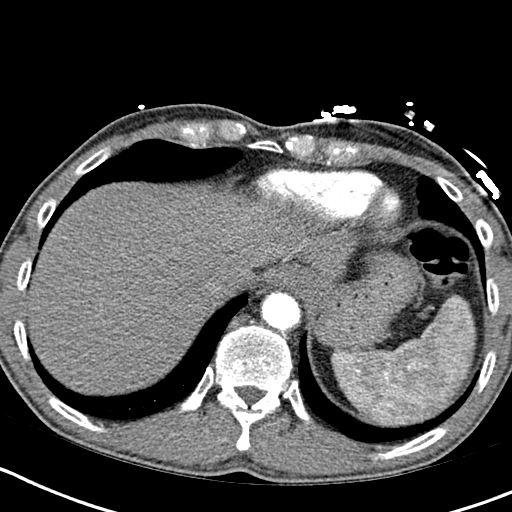
[im 39/96  soft-tissue]
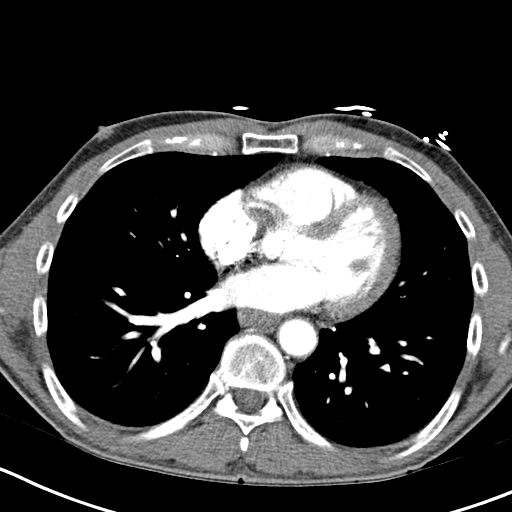
[im 58/96  soft-tissue]
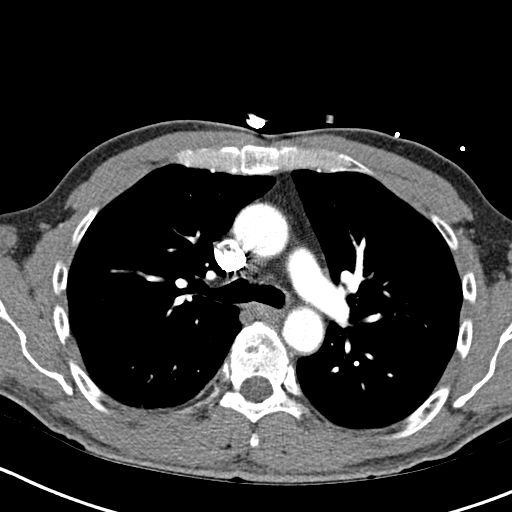
[im 77/96  soft-tissue]
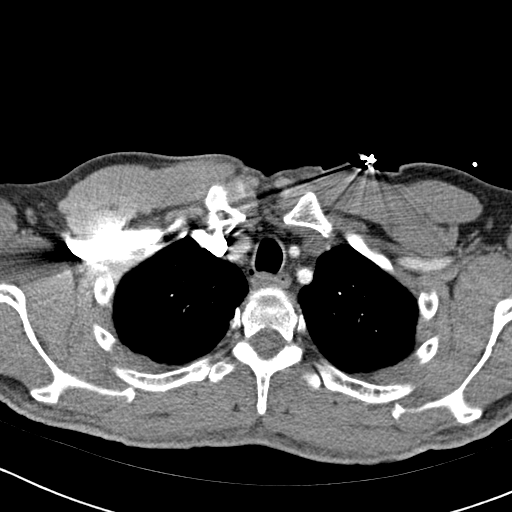

[9 of 46 positions shown; findings below may reference images not displayed]

FINDINGS: Chest CT angiography:

Mediastinum/Nodes: No filling defect is identified in the pulmonary
arterial tree to suggest pulmonary embolus. No acute aortic
findings. No pathologic thoracic adenopathy.

Lungs/Pleura: Unremarkable

Musculoskeletal: Multiple Schmorl' s nodes in the thoracic spine.
Chronic appearing superior endplate compressions at T6 and T7.

Review of the MIP images confirms the above findings.

Noncontrast abdomen and pelvis CT:

Hepatobiliary: Unremarkable

Pancreas: Unremarkable

Spleen: Unremarkable

Adrenals/Urinary Tract: Unremarkable

Stomach/Bowel: Unremarkable.  Appendix normal.

Vascular/Lymphatic: Aortoiliac atherosclerotic vascular disease.

Reproductive: Unremarkable

Other: No supplemental non-categorized findings.

Musculoskeletal: Unremarkable
IMPRESSION: 1. A cause for the patient's left upper quadrant abdominal pain is
not identified.
2. No pulmonary embolus.
3. Chronic appearing superior endplate compressions at T6 and T7.
4.  Aortoiliac atherosclerotic vascular disease.
